# Patient Record
Sex: Male | Born: 1995 | Race: White | Hispanic: No | Marital: Single
Health system: Southern US, Community
[De-identification: ages and names within clinical notes are randomized; demographics above are authoritative.]

---
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
## 2022-05-22 ENCOUNTER — Other Ambulatory Visit: Payer: Self-pay

## 2022-05-22 ENCOUNTER — Encounter (HOSPITAL_COMMUNITY): Payer: Self-pay | Admitting: Pharmacy Technician

## 2022-05-22 ENCOUNTER — Emergency Department (HOSPITAL_COMMUNITY)
Admission: EM | Admit: 2022-05-22 | Discharge: 2022-05-22 | Disposition: A | Discharge status: Home / Self Care | Payer: Self-pay | Attending: Emergency Medicine | Admitting: Emergency Medicine

## 2022-05-22 DIAGNOSIS — A691 Other Vincent's infections: Secondary | ICD-10-CM | POA: Insufficient documentation

## 2022-05-22 DIAGNOSIS — K1379 Other lesions of oral mucosa: Secondary | ICD-10-CM

## 2022-05-22 DIAGNOSIS — R000 Tachycardia, unspecified: Secondary | ICD-10-CM | POA: Insufficient documentation

## 2022-05-22 DIAGNOSIS — K029 Dental caries, unspecified: Secondary | ICD-10-CM | POA: Insufficient documentation

## 2022-05-22 MED ORDER — METRONIDAZOLE 500 MG PO TABS: 500.0000 mg | ORAL_TABLET | Freq: Two times a day (BID) | ORAL | 0 refills | Status: AC

## 2022-05-22 MED ORDER — HYDROCODONE-ACETAMINOPHEN 5-325 MG PO TABS
1.0000 | ORAL_TABLET | Freq: Once | ORAL | Status: AC
  Administered 2022-05-22: 1 via ORAL
  Filled 2022-05-22: qty 1

## 2022-05-22 MED ORDER — AMOXICILLIN-POT CLAVULANATE 875-125 MG PO TABS: 1.0000 | ORAL_TABLET | Freq: Two times a day (BID) | ORAL | 0 refills | Status: AC

## 2022-05-22 MED ORDER — CHLORHEXIDINE GLUCONATE 0.12 % MT SOLN: 15.0000 mL | Freq: Two times a day (BID) | OROMUCOSAL | 0 refills | Status: AC

## 2022-05-22 NOTE — Discharge Instructions (Addendum)
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  As discussed, recommend using oral rinse twice daily for the next 7 days.  Also sent in 2 antibiotics to take twice daily for the next 7 days.  See resource attached to your discharge instructions for dentistry.  Call to set up appointment to earliest convenience.  Please do not hesitate to return to the emergency department if the worrisome signs and symptoms we discussed become apparent.

## 2022-05-22 NOTE — ED Provider Notes (Addendum)
[facility] Provider Note   CSN: [id number] Arrival date & time: 05/22/22  [id number]     History  Chief Complaint  Patient presents with   Dental Pain    Greg Willis is a 27 y.o. male.   Dental Pain   27 year old male presents emergency department with complaints of dental/mouth pain.  Patient reports symptoms beginning 1 to 2 days ago.  Notes history of similar symptoms occurring sporadically over the past year but states typically symptoms resolve with administration of Motrin at home.  Denies having seen a dentist in the past several years.  Denies fever, difficulty breathing/swallowing, fever, tongue swelling, difficulty opening mouth, changes in voice.  No significant pertinent past medical history.  Home Medications Prior to Admission medications   Medication Sig Start Date End Date Taking? Authorizing Provider  amoxicillin-clavulanate (AUGMENTIN) 875-125 MG tablet Take 1 tablet by mouth every 12 (twelve) hours. 05/22/22  Yes Dion Saucier A, PA  chlorhexidine (PERIDEX) 0.12 % solution Use as directed 15 mLs in the mouth or throat 2 (two) times daily for 7 days. 05/22/22 05/29/22 Yes Dion Saucier A, PA  metroNIDAZOLE (FLAGYL) 500 MG tablet Take 1 tablet (500 mg total) by mouth 2 (two) times daily. 05/22/22  Yes Wilnette Kales, PA      Allergies    Patient has no known allergies.    Review of Systems   Review of Systems  All other systems reviewed and are negative.   Physical Exam Updated Vital Signs BP (!) 126/90   Pulse (!) 107   Temp 99 F (37.2 C)   Resp 16   SpO2 97%  Physical Exam Vitals and nursing note reviewed.  Constitutional:      General: He is not in acute distress.    Appearance: He is well-developed.  HENT:     Head: Normocephalic and atraumatic.     Mouth/Throat:     Comments: Patient with diffuse gingival swelling, erythema.  Seems to be worse right-sided upper and lower gingiva with  overlying tenderness to palpation.  Multiple caries appreciated on the right side.  Plaque buildup in between teeth with 2-3 1 to 2 mm sized areas of ulcerative lesions appreciated on gingiva.  No obvious area of fluctuance and gingiva.  Uvula midline right symmetric with phonation.  No sublingual extremity or swelling appreciated.  Tonsils are 1+ bilateral with no obvious exudate.  No posterior pharyngeal erythema appreciated.  Patient without trismus. Eyes:     Conjunctiva/sclera: Conjunctivae normal.  Cardiovascular:     Rate and Rhythm: Normal rate and regular rhythm.     Heart sounds: No murmur heard. Pulmonary:     Effort: Pulmonary effort is normal. No respiratory distress.     Breath sounds: Normal breath sounds. No stridor. No wheezing, rhonchi or rales.  Abdominal:     Palpations: Abdomen is soft.     Tenderness: There is no abdominal tenderness.  Musculoskeletal:        General: No swelling.     Cervical back: Neck supple.  Skin:    General: Skin is warm and dry.     Capillary Refill: Capillary refill takes less than 2 seconds.  Neurological:     Mental Status: He is alert.  Psychiatric:        Mood and Affect: Mood normal.     ED Results / Procedures / Treatments   Labs (all labs ordered are listed, but only abnormal results are displayed)  Labs Reviewed - No data to display  EKG None  Radiology No results found.  Procedures Procedures    Medications Ordered in ED Medications  HYDROcodone-acetaminophen (NORCO/VICODIN) 5-325 MG per tablet 1 tablet (has no administration in time range)    ED Course/ Medical Decision Making/ A&P                             Medical Decision Making Risk Prescription drug management.   This patient presents to the ED for concern of mouth pain, this involves an extensive number of treatment options, and is a complaint that carries with it a high risk of complications and morbidity.  The differential diagnosis includes  cellulitis, erysipelas, periapical abscess, peritonsillar abscess, Ludwig angina, Lemierre's disease, pharyngitis, anaphylaxis, necrotizing ulcerative gingivitis, angioedema    Co morbidities that complicate the patient evaluation  See HPI   Additional history obtained:  Additional history obtained from EMR External records from outside source obtained and reviewed including hospital records   Lab Tests:  N/a   Imaging Studies ordered:  N/a   Cardiac Monitoring: / EKG:  The patient was maintained on a cardiac monitor.  I personally viewed and interpreted the cardiac monitored which showed an underlying rhythm of: Sinus rhythm   Consultations Obtained:  N/a   Problem List / ED Course / Critical interventions / Medication management  Mouth pain/necrotizing ulcerative gingivitis I ordered medication including Norco   Reevaluation of the patient after these medicines showed that the patient improved I have reviewed the patients home medicines and have made adjustments as needed   Social Determinants of Health:  Denies tobacco, illicit drug use.   Test / Admission - Considered:  Mouth pain/necrotizing ulcerative gingivitis Vitals signs significant for mild tachycardia with a heart rate of 107 but otherwise, within normal range and stable throughout visit. Patient with evidence of necrotizing ulcerative gingivitis as indicated above.  No evidence of periapical abscess, Ludwig angina, peritonsillar abscess.  Patient will be discharged with Augmentin as well as for broad-spectrum antibiotic coverage as well as chlorhexidine mouthwash.  Close follow-up with the specialist recommended with resources attached discharge papers.  Patient overall well-appearing, afebrile, tolerating p.o. without difficulty.  Treatment plan discussed at length with patient and he acknowledged understanding was agreeable to said plan. Worrisome signs and symptoms were discussed with the patient,  and the patient acknowledged understanding to return to the ED if noticed. Patient was stable upon discharge.          Final Clinical Impression(s) / ED Diagnoses Final diagnoses:  Trench mouth  Mouth pain    Rx / DC Orders ED Discharge Orders          Ordered    amoxicillin-clavulanate (AUGMENTIN) 875-125 MG tablet  Every 12 hours        05/22/22 1038    metroNIDAZOLE (FLAGYL) 500 MG tablet  2 times daily        05/22/22 1038    chlorhexidine (PERIDEX) 0.12 % solution  2 times daily        05/22/22 1038                 Wilnette Kales, [place] 05/22/22 1052    Milton Ferguson, MD 05/23/22 [phone]

## 2022-05-22 NOTE — ED Triage Notes (Signed)
Pt here with right sided dental pain X2 days. Denies fevers. Taking ibuprofen with minimal relief.
